# Patient Record
Sex: Female | Born: 1970 | Race: White | Hispanic: No | Marital: Married | Smoking: Never smoker
Health system: Southern US, Community
[De-identification: ages and names within clinical notes are randomized; demographics above are authoritative.]

---

## 2005-05-16 ENCOUNTER — Emergency Department (HOSPITAL_COMMUNITY): Admission: EM | Admit: 2005-05-16 | Discharge: 2005-05-16 | Payer: Self-pay | Admitting: Emergency Medicine

## 2005-05-26 ENCOUNTER — Ambulatory Visit: Payer: Self-pay | Admitting: General Practice

## 2005-06-26 ENCOUNTER — Ambulatory Visit: Payer: Self-pay | Admitting: Orthopaedic Surgery

## 2007-05-20 IMAGING — CR DG KNEE COMPLETE 4+V*L*
4 series · 4 of 4 positions shown · non-contrast
Comparison: none

CLINICAL DATA: Fall.  Left knee trauma with pain and swelling.
 LEFT KNEE - 4 VIEW:
 There is no evidence of fracture, dislocation, or joint effusion.  There is no evidence of arthropathy or other focal bone abnormality.  Soft tissues are unremarkable.

[t knee ap left]
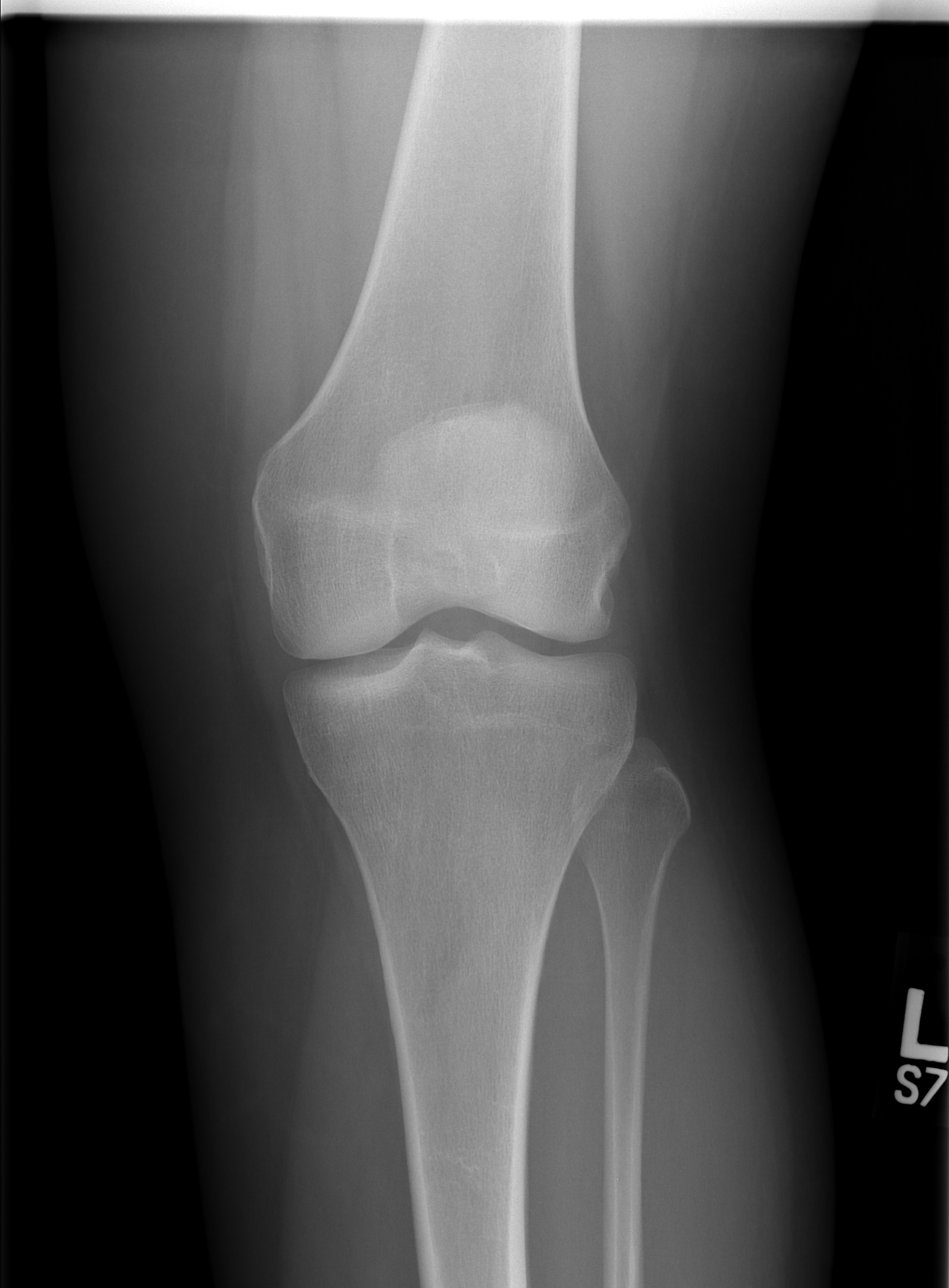

[t knee oblique left (1 of 2)]
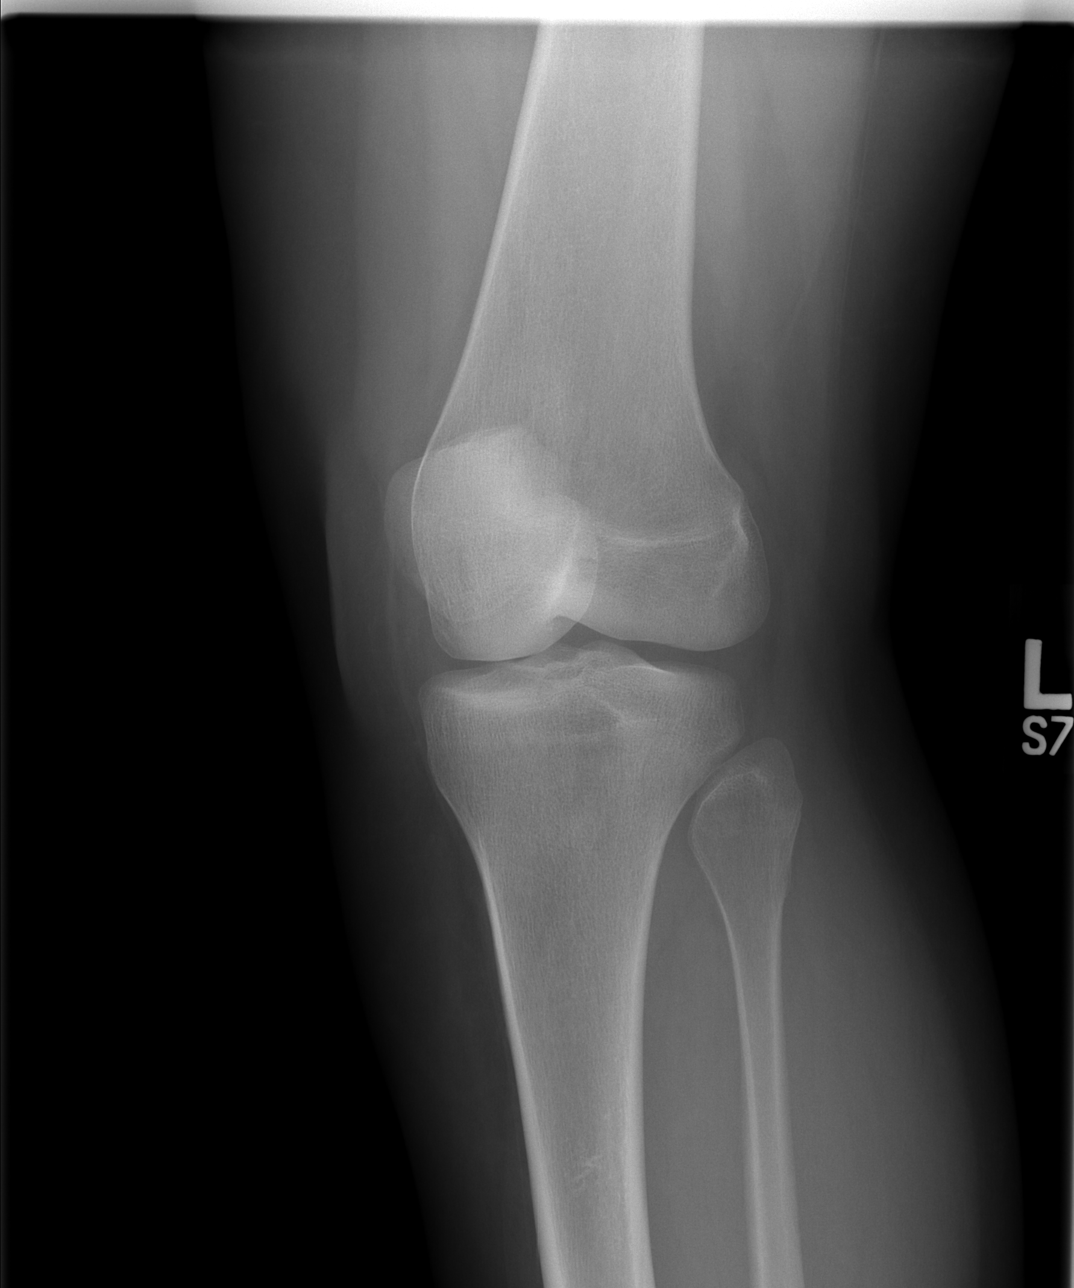

[t knee oblique left (2 of 2)]
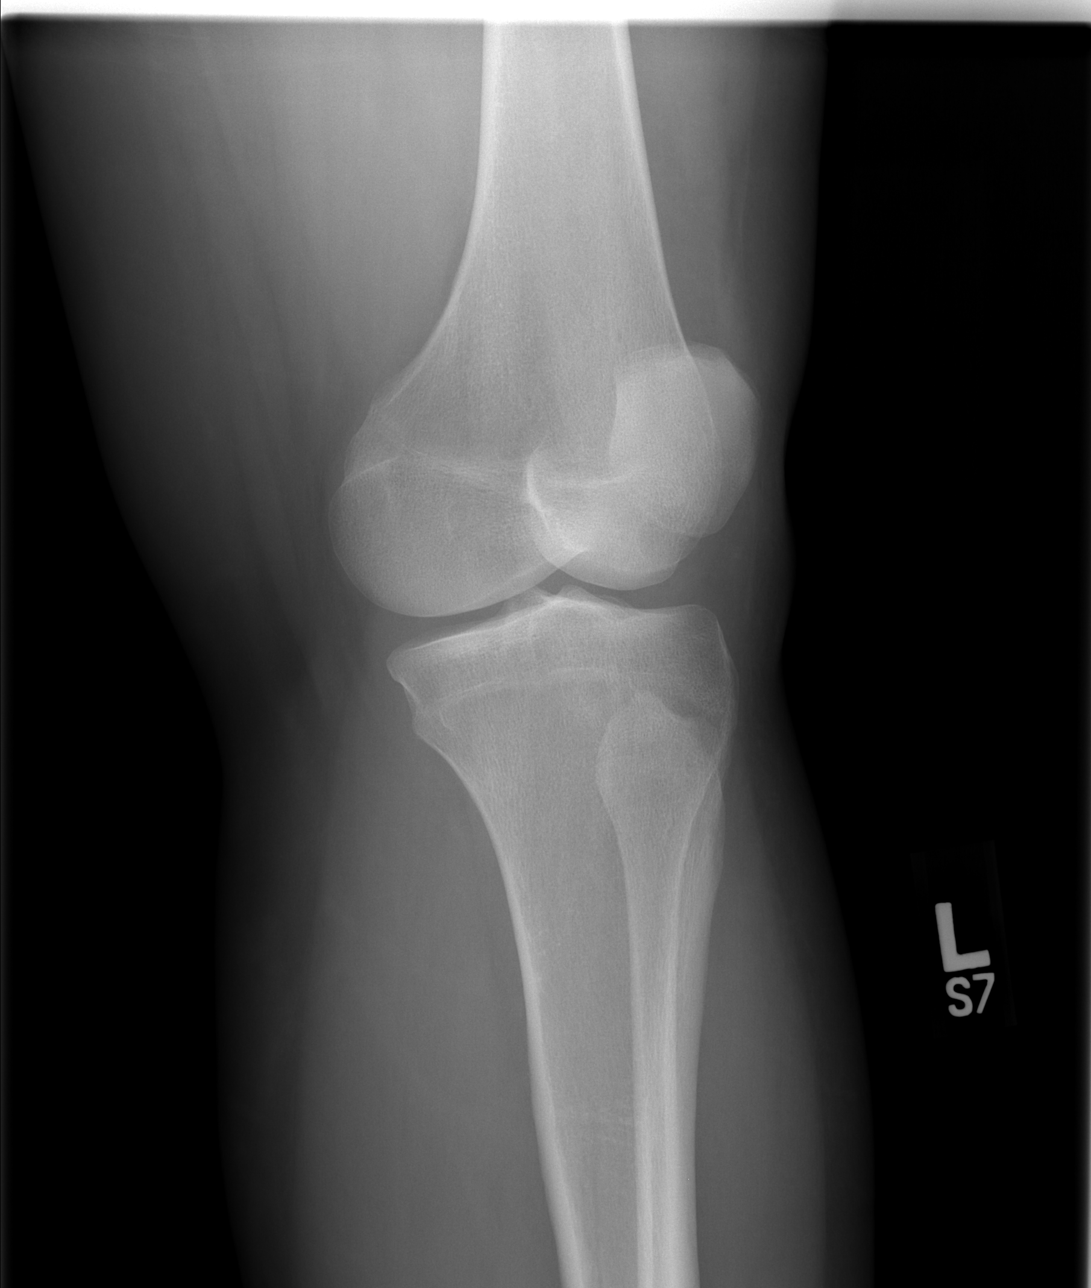

[t knee lat left]
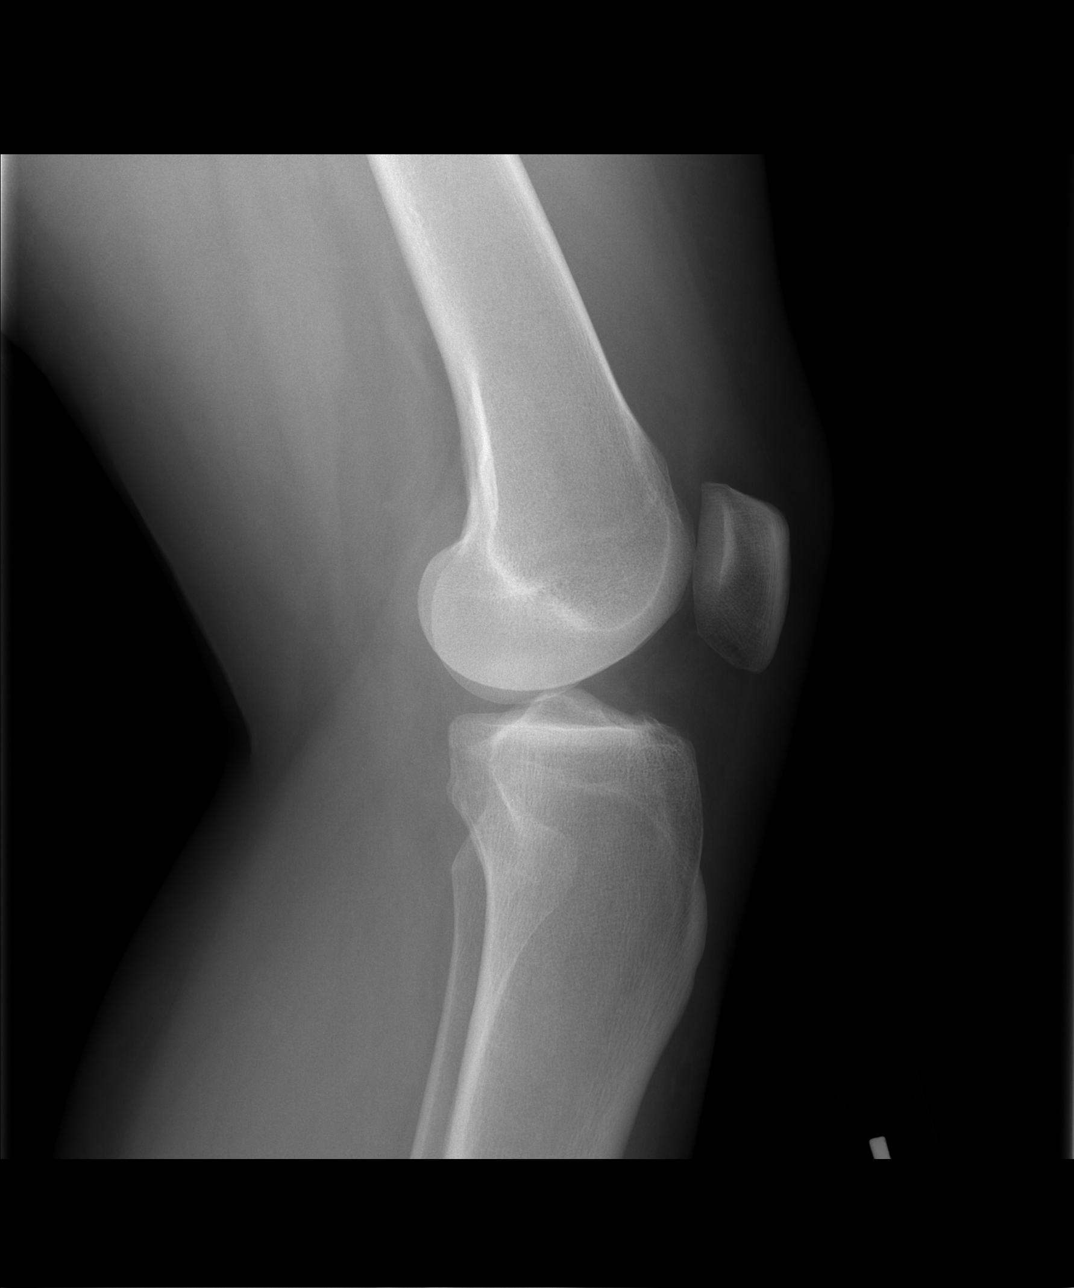

[4 of 4 positions shown; findings below may reference images not displayed]

IMPRESSION: Negative.

## 2017-08-23 ENCOUNTER — Encounter: Payer: Self-pay | Admitting: Podiatry

## 2017-08-23 ENCOUNTER — Ambulatory Visit (INDEPENDENT_AMBULATORY_CARE_PROVIDER_SITE_OTHER): Payer: Managed Care, Other (non HMO)

## 2017-08-23 ENCOUNTER — Ambulatory Visit: Payer: Managed Care, Other (non HMO) | Admitting: Podiatry

## 2017-08-23 VITALS — BP 115/74 | HR 81 | Temp 98.0°F | Resp 16 | Ht 63.0 in | Wt 200.0 lb

## 2017-08-23 DIAGNOSIS — M722 Plantar fascial fibromatosis: Secondary | ICD-10-CM

## 2017-08-23 DIAGNOSIS — M2011 Hallux valgus (acquired), right foot: Secondary | ICD-10-CM | POA: Diagnosis not present

## 2017-08-23 DIAGNOSIS — M2012 Hallux valgus (acquired), left foot: Secondary | ICD-10-CM

## 2017-08-23 DIAGNOSIS — M779 Enthesopathy, unspecified: Secondary | ICD-10-CM | POA: Diagnosis not present

## 2017-08-23 DIAGNOSIS — M21611 Bunion of right foot: Secondary | ICD-10-CM

## 2017-08-23 DIAGNOSIS — M21612 Bunion of left foot: Secondary | ICD-10-CM

## 2017-08-23 MED ORDER — MELOXICAM 15 MG PO TABS: 15.0000 mg | ORAL_TABLET | Freq: Every day | ORAL | 0 refills | Status: AC

## 2017-08-23 NOTE — Progress Notes (Signed)
   Subjective:    Patient ID: Donna Pratt, female    DOB: 1970/02/19, 47 y.o.   MRN: [id number]  HPI    Review of Systems  Musculoskeletal: Positive for arthralgias, joint swelling and myalgias.  All other systems reviewed and are negative.      Objective:   Physical Exam        Assessment & Plan:

## 2017-09-17 NOTE — Progress Notes (Signed)
  Subjective:  Patient ID: Donna Pratt, female    DOB: 26-Aug-1970,  MRN: [id number]  Chief Complaint  Patient presents with  . Foot Pain    B/L lateral foot and bottom heels x 1 year and hallux joint pain x 3 years; 5/10 shapr pain the in the evening -no recent injury Tx: OTC inserts and tylenol    47 y.o. female presents with the above complaint.  Denies other pedal issues  Review of Systems: Negative except as noted in the HPI. Denies N/V/F/Ch.  No past medical history on file.  Current Outpatient Medications:  .  buPROPion (WELLBUTRIN XL) 150 MG 24 hr tablet, Take 150 mg by mouth daily., Disp: , Rfl:  .  FLUoxetine (PROZAC) 40 MG capsule, Take 40 mg by mouth daily., Disp: , Rfl:  .  meloxicam (MOBIC) 15 MG tablet, Take 1 tablet (15 mg total) by mouth daily., Disp: 30 tablet, Rfl: 0  Social History   Tobacco Use  Smoking Status Never Smoker  Smokeless Tobacco Never Used    Not on File Objective:   Vitals:   08/23/17 1028  BP: 115/74  Pulse: 81  Resp: 16  Temp: 98 F (36.7 C)   Body mass index is 35.43 kg/m. Constitutional Well developed. Well nourished.  Vascular Dorsalis pedis pulses palpable bilaterally. Posterior tibial pulses palpable bilaterally. Capillary refill normal to all digits.  No cyanosis or clubbing noted. Pedal hair growth normal.  Neurologic Normal speech. Oriented to person, place, and time. Epicritic sensation to light touch grossly present bilaterally.  Dermatologic Nails well groomed and normal in appearance. No open wounds. No skin lesions.  Orthopedic: Normal joint ROM without pain or crepitus bilaterally. Pain palpation about the medial calcaneal tuber laterally.  Palpation about the plantar medial calcaneal tuber bilaterally.   Radiographs: HAV deformity bilateral.  Plantar calcaneal spurring.  No significant arthrosis. Assessment:   1. Plantar fasciitis   2. Acquired hallux valgus of both feet   3. Hallux valgus with  bunions of left foot   4. Hallux valgus with bunions of right foot   5. Capsulitis    Plan:  Patient was evaluated and treated and all questions answered.  HAV deformity bilaterally -X-ray taken reviewed -Dispensed tube foam bunion shield -Rx meloxicam.  Risks and benefits of medication discussed proper taking discussed -Educated on proper shoe gear.  Advised wider shoe gears to provide room for her deformity -Should conservative therapy fail would consider possible surgical intervention  Return in about 6 weeks (around 10/04/2017) for Bunion f/u.

## 2017-09-21 ENCOUNTER — Other Ambulatory Visit: Payer: Self-pay | Admitting: Podiatry

## 2017-09-21 DIAGNOSIS — M2012 Hallux valgus (acquired), left foot: Secondary | ICD-10-CM

## 2017-09-21 DIAGNOSIS — M21611 Bunion of right foot: Secondary | ICD-10-CM

## 2017-09-21 DIAGNOSIS — M2011 Hallux valgus (acquired), right foot: Secondary | ICD-10-CM

## 2017-09-21 DIAGNOSIS — M779 Enthesopathy, unspecified: Secondary | ICD-10-CM

## 2017-09-21 DIAGNOSIS — M722 Plantar fascial fibromatosis: Secondary | ICD-10-CM

## 2017-09-21 DIAGNOSIS — M21612 Bunion of left foot: Secondary | ICD-10-CM

## 2017-10-04 ENCOUNTER — Ambulatory Visit: Payer: Managed Care, Other (non HMO) | Admitting: Podiatry

## 2018-03-19 ENCOUNTER — Encounter (HOSPITAL_COMMUNITY): Payer: Self-pay

## 2018-03-19 ENCOUNTER — Emergency Department (HOSPITAL_COMMUNITY)
Admission: EM | Admit: 2018-03-19 | Discharge: 2018-03-19 | Disposition: A | Discharge status: Home / Self Care | Payer: Managed Care, Other (non HMO) | Attending: Emergency Medicine | Admitting: Emergency Medicine

## 2018-03-19 ENCOUNTER — Emergency Department (HOSPITAL_COMMUNITY): Payer: Managed Care, Other (non HMO)

## 2018-03-19 ENCOUNTER — Other Ambulatory Visit: Payer: Self-pay

## 2018-03-19 DIAGNOSIS — S161XXA Strain of muscle, fascia and tendon at neck level, initial encounter: Secondary | ICD-10-CM

## 2018-03-19 DIAGNOSIS — Y92410 Unspecified street and highway as the place of occurrence of the external cause: Secondary | ICD-10-CM | POA: Diagnosis not present

## 2018-03-19 DIAGNOSIS — Z79899 Other long term (current) drug therapy: Secondary | ICD-10-CM | POA: Diagnosis not present

## 2018-03-19 DIAGNOSIS — Y999 Unspecified external cause status: Secondary | ICD-10-CM | POA: Diagnosis not present

## 2018-03-19 DIAGNOSIS — R51 Headache: Secondary | ICD-10-CM | POA: Insufficient documentation

## 2018-03-19 DIAGNOSIS — Y9389 Activity, other specified: Secondary | ICD-10-CM | POA: Diagnosis not present

## 2018-03-19 DIAGNOSIS — S199XXA Unspecified injury of neck, initial encounter: Secondary | ICD-10-CM | POA: Diagnosis present

## 2018-03-19 DIAGNOSIS — S93402A Sprain of unspecified ligament of left ankle, initial encounter: Secondary | ICD-10-CM | POA: Diagnosis not present

## 2018-03-19 LAB — I-STAT BETA HCG BLOOD, ED (MC, WL, AP ONLY)

## 2018-03-19 MED ORDER — IBUPROFEN 600 MG PO TABS: 600.0000 mg | ORAL_TABLET | Freq: Four times a day (QID) | ORAL | 0 refills | Status: AC | PRN

## 2018-03-19 MED ORDER — FENTANYL CITRATE (PF) 100 MCG/2ML IJ SOLN
50.0000 ug | Freq: Once | INTRAMUSCULAR | Status: AC
  Administered 2018-03-19: 50 ug via INTRAVENOUS
  Filled 2018-03-19: qty 2

## 2018-03-19 MED ORDER — HYDROCODONE-ACETAMINOPHEN 5-325 MG PO TABS: 2.0000 | ORAL_TABLET | ORAL | 0 refills | Status: AC | PRN
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
## 2018-03-19 NOTE — ED Notes (Signed)
Patient verbalizes understanding of discharge instructions. Opportunity for questioning and answers were provided. Armband removed by staff, pt discharged from ED.  

## 2018-03-19 NOTE — ED Notes (Addendum)
Patient transported to CT 
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
## 2023-01-25 DIAGNOSIS — L309 Dermatitis, unspecified: Secondary | ICD-10-CM | POA: Diagnosis not present

## 2023-01-25 DIAGNOSIS — R7303 Prediabetes: Secondary | ICD-10-CM | POA: Diagnosis not present

## 2023-01-25 DIAGNOSIS — E782 Mixed hyperlipidemia: Secondary | ICD-10-CM | POA: Diagnosis not present

## 2023-01-25 DIAGNOSIS — R748 Abnormal levels of other serum enzymes: Secondary | ICD-10-CM | POA: Diagnosis not present

## 2023-01-25 DIAGNOSIS — Z79899 Other long term (current) drug therapy: Secondary | ICD-10-CM | POA: Diagnosis not present

## 2023-01-25 DIAGNOSIS — R319 Hematuria, unspecified: Secondary | ICD-10-CM | POA: Diagnosis not present

## 2023-01-25 DIAGNOSIS — F418 Other specified anxiety disorders: Secondary | ICD-10-CM | POA: Diagnosis not present

## 2023-01-25 DIAGNOSIS — Z1331 Encounter for screening for depression: Secondary | ICD-10-CM | POA: Diagnosis not present

## 2023-03-04 DIAGNOSIS — L301 Dyshidrosis [pompholyx]: Secondary | ICD-10-CM | POA: Diagnosis not present

## 2023-03-04 DIAGNOSIS — L300 Nummular dermatitis: Secondary | ICD-10-CM | POA: Diagnosis not present

## 2023-07-26 DIAGNOSIS — E782 Mixed hyperlipidemia: Secondary | ICD-10-CM | POA: Diagnosis not present

## 2023-07-26 DIAGNOSIS — R7303 Prediabetes: Secondary | ICD-10-CM | POA: Diagnosis not present

## 2023-07-26 DIAGNOSIS — R748 Abnormal levels of other serum enzymes: Secondary | ICD-10-CM | POA: Diagnosis not present

## 2023-07-26 DIAGNOSIS — F418 Other specified anxiety disorders: Secondary | ICD-10-CM | POA: Diagnosis not present
